# Patient Record
Sex: Female | Born: 1975 | Race: White | Hispanic: No | Marital: Married | Smoking: Never smoker
Health system: Southern US, Community
[De-identification: ages and names within clinical notes are randomized; demographics above are authoritative.]

## PROBLEM LIST (undated history)

## (undated) ENCOUNTER — Inpatient Hospital Stay (HOSPITAL_COMMUNITY): Payer: Self-pay

## (undated) DIAGNOSIS — B379 Candidiasis, unspecified: Secondary | ICD-10-CM

## (undated) DIAGNOSIS — Z975 Presence of (intrauterine) contraceptive device: Secondary | ICD-10-CM

## (undated) DIAGNOSIS — H9191 Unspecified hearing loss, right ear: Secondary | ICD-10-CM

## (undated) DIAGNOSIS — R7611 Nonspecific reaction to tuberculin skin test without active tuberculosis: Secondary | ICD-10-CM

## (undated) HISTORY — DX: Presence of (intrauterine) contraceptive device: Z97.5

## (undated) HISTORY — PX: WISDOM TOOTH EXTRACTION: SHX21

## (undated) HISTORY — DX: Unspecified hearing loss, right ear: H91.91

## (undated) HISTORY — DX: Candidiasis, unspecified: B37.9

## (undated) HISTORY — DX: Nonspecific reaction to tuberculin skin test without active tuberculosis: R76.11

---

## 1998-09-16 ENCOUNTER — Encounter: Admission: RE | Admit: 1998-09-16 | Discharge: 1998-09-16 | Payer: Self-pay | Admitting: Infectious Diseases

## 2000-01-30 ENCOUNTER — Other Ambulatory Visit: Admission: RE | Admit: 2000-01-30 | Discharge: 2000-01-30 | Payer: Self-pay | Admitting: Obstetrics and Gynecology

## 2001-02-24 ENCOUNTER — Other Ambulatory Visit: Admission: RE | Admit: 2001-02-24 | Discharge: 2001-02-24 | Payer: Self-pay | Admitting: Obstetrics and Gynecology

## 2002-08-20 ENCOUNTER — Other Ambulatory Visit: Admission: RE | Admit: 2002-08-20 | Discharge: 2002-08-20 | Payer: Self-pay | Admitting: Obstetrics and Gynecology

## 2002-12-15 ENCOUNTER — Encounter: Payer: Self-pay | Admitting: Obstetrics and Gynecology

## 2002-12-15 ENCOUNTER — Ambulatory Visit (HOSPITAL_COMMUNITY): Admission: RE | Admit: 2002-12-15 | Discharge: 2002-12-15 | Payer: Self-pay | Admitting: Obstetrics and Gynecology

## 2003-03-19 ENCOUNTER — Inpatient Hospital Stay (HOSPITAL_COMMUNITY): Admission: AD | Admit: 2003-03-19 | Discharge: 2003-03-21 | Payer: Self-pay | Admitting: Obstetrics and Gynecology

## 2003-03-22 ENCOUNTER — Encounter: Admission: RE | Admit: 2003-03-22 | Discharge: 2003-04-21 | Payer: Self-pay | Admitting: Obstetrics and Gynecology

## 2003-09-30 ENCOUNTER — Other Ambulatory Visit: Admission: RE | Admit: 2003-09-30 | Discharge: 2003-09-30 | Payer: Self-pay | Admitting: Obstetrics and Gynecology

## 2004-10-03 ENCOUNTER — Other Ambulatory Visit: Admission: RE | Admit: 2004-10-03 | Discharge: 2004-10-03 | Payer: Self-pay | Admitting: Obstetrics and Gynecology

## 2006-01-04 ENCOUNTER — Inpatient Hospital Stay (HOSPITAL_COMMUNITY): Admission: AD | Admit: 2006-01-04 | Discharge: 2006-01-06 | Payer: Self-pay | Admitting: Obstetrics and Gynecology

## 2006-08-26 ENCOUNTER — Other Ambulatory Visit: Admission: RE | Admit: 2006-08-26 | Discharge: 2006-08-26 | Payer: Self-pay | Admitting: Obstetrics and Gynecology

## 2012-10-07 ENCOUNTER — Ambulatory Visit (INDEPENDENT_AMBULATORY_CARE_PROVIDER_SITE_OTHER): Payer: BC Managed Care – PPO | Admitting: Obstetrics and Gynecology

## 2012-10-07 ENCOUNTER — Encounter: Payer: Self-pay | Admitting: Obstetrics and Gynecology

## 2012-10-07 VITALS — BP 100/70 | HR 70 | Ht 63.5 in | Wt 126.0 lb

## 2012-10-07 DIAGNOSIS — Z975 Presence of (intrauterine) contraceptive device: Secondary | ICD-10-CM

## 2012-10-07 DIAGNOSIS — Z124 Encounter for screening for malignant neoplasm of cervix: Secondary | ICD-10-CM

## 2012-10-07 DIAGNOSIS — Z01419 Encounter for gynecological examination (general) (routine) without abnormal findings: Secondary | ICD-10-CM

## 2012-10-07 NOTE — Progress Notes (Signed)
Subjective:    Tamara Reese is a 36 y.o. female, G2P2, who presents for an annual exam. The patient reports spotting since IUD insertion, typically 1- 2 times a month x 5-7 days but no cramping.  IUD was inserted 09/15/08.  Menstrual cycle:   LMP: Patient's last menstrual period was 10/04/2012.             Review of Systems Pertinent items are noted in HPI. Denies pelvic pain, urinary tract symptoms, vaginitis symptoms, irregular bleeding, menopausal symptoms, change in bowel habits Reese rectal bleeding   Objective:    BP 100/70  Pulse 70  Ht 5' 3.5" (1.613 m)  Wt 126 lb (57.153 kg)  BMI 21.97 kg/m2  LMP 10/04/2012   Wt Readings from Last 1 Encounters:  10/07/12 126 lb (57.153 kg)   Body mass index is 21.97 kg/(m^2). General Appearance: Alert, no acute distress HEENT: Grossly normal Neck / Thyroid: Supple, no thyromegaly Reese cervical adenopathy Lungs: Clear to auscultation bilaterally Back: No CVA tenderness Breast Exam: No masses Reese nodes.No dimpling, nipple retraction Reese discharge. Cardiovascular: Regular rate and rhythm.  Gastrointestinal: Soft, non-tender, no masses Reese organomegaly Pelvic Exam: EGBUS-wnl, vagina-normal rugae, cervix- without lesions Reese tenderness, uterus appears normal size shape and consistency, adnexae-no masses Reese tenderness Rectovaginal: no masses and normal sphincter tone Lymphatic Exam: Non-palpable nodes in neck, clavicular,  axillary, Reese inguinal regions  Skin: no rashes Reese abnormalities Extremities: no clubbing cyanosis Reese edema  Neurologic: grossly normal Psychiatric: Alert and oriented  UPT: negative   Assessment:   Routine GYN Exam   Plan:    PAP sent  RTO 1 year Reese prn  Destane Speas,ELMIRAPA-C

## 2012-10-07 NOTE — Patient Instructions (Signed)
Multivitamin with iron and at least 400 mcg of folic acid

## 2012-10-07 NOTE — Progress Notes (Signed)
Regular Periods: no Mammogram: no  Monthly Breast Ex.: yes Exercise: yes  Tetanus < 10 years: no Seatbelts: yes  NI. Bladder Functn.: yes Abuse at home: no  Daily BM's: yes Stressful Work: yes  Healthy Diet: yes Sigmoid-Colonoscopy: no  Calcium: no Medical problems this year: no problems   LAST WUJ:WJXBJ HPV NOT DETECTED  Contraception: IUD MIRENA  Mammogram:  NO  PCP: NO  PMH:  NO CHANGE  FMH: NO CHANGE  Last Bone Scan: NO  PT IS MARRIED

## 2012-10-09 LAB — PAP IG W/ RFLX HPV ASCU

## 2012-10-10 LAB — HUMAN PAPILLOMAVIRUS, HIGH RISK: HPV DNA High Risk: NOT DETECTED

## 2012-10-13 ENCOUNTER — Encounter: Payer: Self-pay | Admitting: Obstetrics and Gynecology

## 2014-10-18 ENCOUNTER — Encounter: Payer: Self-pay | Admitting: Obstetrics and Gynecology

## 2014-10-21 LAB — OB RESULTS CONSOLE ANTIBODY SCREEN: Antibody Screen: NEGATIVE

## 2014-10-21 LAB — OB RESULTS CONSOLE HIV ANTIBODY (ROUTINE TESTING): HIV: NONREACTIVE

## 2014-10-21 LAB — OB RESULTS CONSOLE GC/CHLAMYDIA
Chlamydia: NEGATIVE
Gonorrhea: NEGATIVE

## 2014-10-21 LAB — OB RESULTS CONSOLE RPR: RPR: NONREACTIVE

## 2014-10-21 LAB — OB RESULTS CONSOLE RUBELLA ANTIBODY, IGM: Rubella: IMMUNE

## 2014-10-21 LAB — OB RESULTS CONSOLE ABO/RH: RH Type: POSITIVE

## 2014-10-21 LAB — OB RESULTS CONSOLE HEPATITIS B SURFACE ANTIGEN: Hepatitis B Surface Ag: NEGATIVE

## 2014-11-16 ENCOUNTER — Inpatient Hospital Stay (HOSPITAL_COMMUNITY)
Admission: AD | Admit: 2014-11-16 | Discharge: 2014-11-16 | Disposition: A | Discharge status: Home / Self Care | Payer: No Typology Code available for payment source | Source: Ambulatory Visit | Attending: Obstetrics & Gynecology | Admitting: Obstetrics & Gynecology

## 2014-11-16 ENCOUNTER — Encounter (HOSPITAL_COMMUNITY): Payer: Self-pay

## 2014-11-16 DIAGNOSIS — R0602 Shortness of breath: Secondary | ICD-10-CM | POA: Diagnosis present

## 2014-11-16 DIAGNOSIS — O210 Mild hyperemesis gravidarum: Secondary | ICD-10-CM | POA: Insufficient documentation

## 2014-11-16 DIAGNOSIS — R42 Dizziness and giddiness: Secondary | ICD-10-CM | POA: Diagnosis not present

## 2014-11-16 DIAGNOSIS — O9989 Other specified diseases and conditions complicating pregnancy, childbirth and the puerperium: Secondary | ICD-10-CM | POA: Insufficient documentation

## 2014-11-16 DIAGNOSIS — Z3A10 10 weeks gestation of pregnancy: Secondary | ICD-10-CM | POA: Insufficient documentation

## 2014-11-16 LAB — CBC
HCT: 35.8 % — ABNORMAL LOW (ref 36.0–46.0)
HEMOGLOBIN: 12.7 g/dL (ref 12.0–15.0)
MCH: 34.7 pg — AB (ref 26.0–34.0)
MCHC: 35.5 g/dL (ref 30.0–36.0)
MCV: 97.8 fL (ref 78.0–100.0)
Platelets: 252 10*3/uL (ref 150–400)
RBC: 3.66 MIL/uL — AB (ref 3.87–5.11)
RDW: 12.5 % (ref 11.5–15.5)
WBC: 9.3 10*3/uL (ref 4.0–10.5)

## 2014-11-16 LAB — URINALYSIS, ROUTINE W REFLEX MICROSCOPIC
Bilirubin Urine: NEGATIVE
Glucose, UA: NEGATIVE mg/dL
Ketones, ur: NEGATIVE mg/dL
Leukocytes, UA: NEGATIVE
Nitrite: NEGATIVE
Protein, ur: NEGATIVE mg/dL
Specific Gravity, Urine: >1.03 — ABNORMAL HIGH (ref 1.005–1.030)
Urobilinogen, UA: 0.2 mg/dL (ref 0.0–1.0)
pH: 6 (ref 5.0–8.0)

## 2014-11-16 LAB — URINE MICROSCOPIC-ADD ON

## 2014-11-16 MED ORDER — LACTATED RINGERS IV BOLUS (SEPSIS): 500.0000 mL | Freq: Once | INTRAVENOUS | Status: DC

## 2014-11-16 MED ORDER — PROMETHAZINE HCL 25 MG/ML IJ SOLN: 12.5000 mg | Freq: Once | INTRAMUSCULAR | Status: DC

## 2014-11-16 MED ORDER — LACTATED RINGERS IV SOLN: INTRAVENOUS | Status: DC

## 2014-11-16 NOTE — MAU Provider Note (Signed)
History    Tamara Reese is a 38 y.o. G3P2002 at 10.6wks who presents for intermittent nausea, dizziness, and SOB.  Patient reports grogginess on Saturday and Sunday after returning home from traveling via plane. States dizziness started yesterday and is more pronounced today.  Reports feeling winded when doing "day to day things."  Denies SOB with rest and reports able "to calm myself after walking." Denies vomiting, reports nausea, but not taking any medications.  No issues with constipation or diarrhea.  No issues with urination. Reports drinking 4-6 "kitchen glasses" of water every day and adequate nutrition as not eating brings about nausea. No history of anemia or heart conditions.  No issues with vaginal discharge or bleeding.   There are no active problems to display for this patient.   Chief Complaint  Patient presents with  . Dizziness  . Shortness of Breath  . Nausea   HPI  OB History    Gravida Para Term Preterm AB TAB SAB Ectopic Multiple Living   3 2        2       Past Medical History  Diagnosis Date  . 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 IUD (intrauterine device) in place   . Yeast infection     h/o  . Deafness in right ear     Past Surgical History  Procedure Laterality Date  . Wisdom tooth extraction      Family History  Problem Relation Age of Onset  . Cancer Maternal Aunt     breast  . Stroke Maternal Grandmother   . Heart disease Maternal Grandfather   . Hypertension Maternal Grandfather   . Heart disease Paternal Grandfather     History  Substance Use Topics  . Smoking status: Never Smoker   . Smokeless tobacco: Never Used  . Alcohol Use: Yes    Allergies:  Allergies  Allergen Reactions  . Erythromycin   . Keflex [Cephalexin]   . Penicillins     No prescriptions prior to admission    ROS  See HPI Above Physical Exam   Blood pressure 114/65, pulse 80, temperature 98.9 F (37.2 C), temperature source Oral, resp. rate 16, height 5'  3" (1.6 m), weight 136 lb (61.689 kg), last menstrual period 09/01/2014, SpO2 100 %. Results for orders placed or performed during the hospital encounter of 11/16/14 (from the past 24 hour(s))  Urinalysis, Routine w reflex microscopic     Status: Abnormal   Collection Time: 11/16/14  5:45 PM  Result Value Ref Range   Color, Urine YELLOW YELLOW   APPearance CLEAR CLEAR   Specific Gravity, Urine >1.030 (H) 1.005 - 1.030   pH 6.0 5.0 - 8.0   Glucose, UA NEGATIVE NEGATIVE mg/dL   Hgb urine dipstick SMALL (A) NEGATIVE   Bilirubin Urine NEGATIVE NEGATIVE   Ketones, ur NEGATIVE NEGATIVE mg/dL   Protein, ur NEGATIVE NEGATIVE mg/dL   Urobilinogen, UA 0.2 0.0 - 1.0 mg/dL   Nitrite NEGATIVE NEGATIVE   Leukocytes, UA NEGATIVE NEGATIVE  Urine microscopic-add on     Status: Abnormal   Collection Time: 11/16/14  5:45 PM  Result Value Ref Range   Squamous Epithelial / LPF FEW (A) RARE   WBC, UA 0-2 <3 WBC/hpf   RBC / HPF 0-2 <3 RBC/hpf  CBC     Status: Abnormal   Collection Time: 11/16/14  8:18 PM  Result Value Ref Range   WBC 9.3 4.0 - 10.5 K/uL   RBC 3.66 (L) 3.87 -  5.11 MIL/uL   Hemoglobin 12.7 12.0 - 15.0 g/dL   HCT 86.535.8 (L) 78.436.0 - 69.646.0 %   MCV 97.8 78.0 - 100.0 fL   MCH 34.7 (H) 26.0 - 34.0 pg   MCHC 35.5 30.0 - 36.0 g/dL   RDW 29.512.5 28.411.5 - 13.215.5 %   Platelets 252 150 - 400 K/uL    Physical Exam  Vitals reviewed. Constitutional: She is oriented to person, place, and time. She appears well-developed and well-nourished. No distress.  Cardiovascular: Normal rate, regular rhythm and normal heart sounds.   Respiratory: Effort normal and breath sounds normal. No respiratory distress.  GI: Soft.  Musculoskeletal: Normal range of motion.  Neurological: She is alert and oriented to person, place, and time.  Skin: Skin is warm and dry.    ED Course  Assessment: IUP at 10.6wks Dizziness Nausea  Plan: -UA-Few Bacteria, Culture sent -IV Bolus -CBC-Pending -Declines IV  anti-emetic  Follow Up (2100) -Patient declines IV fluids -CBC as above and within normal limits -Encouraged to rest  -Encouraged to call if any questions or concerns arise prior to next scheduled office visit.  -Bleeding Precautions Given -Discharged to home in stable condition  Shandon Matson LYNN CNM, MSN 11/16/2014 7:36 PM

## 2014-11-16 NOTE — MAU Note (Signed)
Patient sates she traveled over the weekend. Started to feel not as well on 11-28 but has progressed until today has had random dizziness and shortness of breath that is worse with lying down. Some nausea, no vomiting. Denies vaginal bleeding, abdominal pain or discharge.

## 2014-11-16 NOTE — MAU Note (Signed)
Pt states she has been feeling dizzy throughout pregnancy , weakness started on Saturday and dizziness started yesterday and became worse today

## 2014-11-16 NOTE — Discharge Instructions (Signed)

## 2014-11-17 LAB — CULTURE, OB URINE
Colony Count: NO GROWTH
Culture: NO GROWTH

## 2014-12-17 NOTE — L&D Delivery Note (Signed)
Delivery Note At 9:02 PM a viable female "Funk" was delivered via Vaginal, Spontaneous Delivery (Presentation: OA restituting to Right Occiput Anterior).  APGARS: 8, 9; weight pending.   Placenta status: Intact, Spontaneous.  Cord: 3 vessels with the following complications: None.  Cord pH: NA  Anesthesia: Epidural  Episiotomy: None Lacerations: 1st degree vaginal Suture Repair: 3.0 vicryl Est. Blood Loss (mL): 451  Mom to postpartum.  Baby to Couplet care / Skin to Skin.  Breastfeeding.  Plans Micronor for contraception.  Sherre Scarlet 06/06/2015, 9:32 PM

## 2015-05-02 ENCOUNTER — Encounter (HOSPITAL_COMMUNITY): Payer: Self-pay | Admitting: *Deleted

## 2015-05-02 ENCOUNTER — Inpatient Hospital Stay (HOSPITAL_COMMUNITY)
Admission: AD | Admit: 2015-05-02 | Discharge: 2015-05-02 | Disposition: A | Discharge status: Home / Self Care | Payer: BLUE CROSS/BLUE SHIELD | Source: Ambulatory Visit | Attending: Obstetrics & Gynecology | Admitting: Obstetrics & Gynecology

## 2015-05-02 DIAGNOSIS — Z3A34 34 weeks gestation of pregnancy: Secondary | ICD-10-CM | POA: Diagnosis not present

## 2015-05-02 DIAGNOSIS — O368130 Decreased fetal movements, third trimester, not applicable or unspecified: Secondary | ICD-10-CM | POA: Diagnosis present

## 2015-05-02 DIAGNOSIS — Z3493 Encounter for supervision of normal pregnancy, unspecified, third trimester: Secondary | ICD-10-CM

## 2015-05-02 NOTE — MAU Provider Note (Signed)
Tamara Reese is a 39 y.o. G3P2 at 34.5 weeks presented to MAU c/o decrease fetal movement earlier and ctx earlier, both have resolved.  She denies vb or lof with +FM at this time.   History     There are no active problems to display for this patient.   No chief complaint on file.  HPI  OB History    Gravida Para Term Preterm AB TAB SAB Ectopic Multiple Living   3 2        2       Past Medical History  Diagnosis Date  . 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 IUD (intrauterine device) in place   . Yeast infection     h/o  . Deafness in right ear     Past Surgical History  Procedure Laterality Date  . Wisdom tooth extraction      Family History  Problem Relation Age of Onset  . Cancer Maternal Aunt     breast  . Stroke Maternal Grandmother   . Heart disease Maternal Grandfather   . Hypertension Maternal Grandfather   . Heart disease Paternal Grandfather     History  Substance Use Topics  . Smoking status: Never Smoker   . Smokeless tobacco: Never Used  . Alcohol Use: No    Allergies:  Allergies  Allergen Reactions  . Erythromycin Nausea Only  . Keflex [Cephalexin] Swelling  . Penicillins Rash    Prescriptions prior to admission  Medication Sig Dispense Refill Last Dose  . Prenatal Vit-Fe Fumarate-FA (PRENATAL MULTIVITAMIN) TABS tablet Take 1 tablet by mouth daily at 12 noon.   05/01/2015 at Unknown time  . acetaminophen (TYLENOL) 500 MG tablet Take 500 mg by mouth every 6 (six) hours as needed for moderate pain or fever.   More than a month at Unknown time    ROS See HPI above, all other systems are negative  Physical Exam   Blood pressure 129/72, pulse 87, temperature 97.8 F (36.6 C), temperature source Oral, resp. rate 16, height 5\' 3"  (1.6 m), weight 166 lb (75.297 kg), last menstrual period 09/01/2014.  Physical Exam Ext:  WNL ABD: Soft, non tender to palpation, no rebound or guarding SVE:   ED Course  Assessment: IUP at   34.5weeks Membranes: intact FHR: Category 1 CTX:  none minutes VE unchange   Plan: -Discussed need to follow up in office  -Bleeding and PTL Precautions -Encouraged to call if any questions or concerns arise prior to next scheduled office visit.  -Discharged to home in stable condition   Guerry Covington, CNM, MSN 05/02/2015. 9:39 PM

## 2015-05-02 NOTE — MAU Note (Signed)
Pt. States she has been contracting regularly since 4 pm today. No leakage of fluid or bleeding. + Baby movement. Last appointment was 2 Fridays ago. Next appointment is scheduled for this coming Friday.

## 2015-05-02 NOTE — Discharge Instructions (Signed)

## 2015-05-05 LAB — OB RESULTS CONSOLE GBS: GBS: NEGATIVE

## 2015-06-04 ENCOUNTER — Encounter (HOSPITAL_COMMUNITY): Payer: Self-pay | Admitting: *Deleted

## 2015-06-04 ENCOUNTER — Inpatient Hospital Stay (HOSPITAL_COMMUNITY)
Admission: AD | Admit: 2015-06-04 | Discharge: 2015-06-04 | Disposition: A | Discharge status: Home / Self Care | Payer: BLUE CROSS/BLUE SHIELD | Source: Ambulatory Visit | Attending: Obstetrics and Gynecology | Admitting: Obstetrics and Gynecology

## 2015-06-04 DIAGNOSIS — Z3A39 39 weeks gestation of pregnancy: Secondary | ICD-10-CM | POA: Insufficient documentation

## 2015-06-04 DIAGNOSIS — O479 False labor, unspecified: Secondary | ICD-10-CM

## 2015-06-04 NOTE — Progress Notes (Signed)
Gerrit Heck CNM in to reck pt. Discussed d/c plan.  Written and verbal d/c instructions given and understanding voiced.

## 2015-06-04 NOTE — Progress Notes (Signed)
Gerrit Heck CNM in to see pt and discuss plan of care

## 2015-06-04 NOTE — Progress Notes (Signed)
Gerrit Heck CNM aware of pt's admission and status. WIll see pt

## 2015-06-04 NOTE — Discharge Instructions (Signed)
Braxton Hicks Contractions °Contractions of the uterus can occur throughout pregnancy. Contractions are not always a sign that you are in labor.  °WHAT ARE BRAXTON HICKS CONTRACTIONS?  °Contractions that occur before labor are called Braxton Hicks contractions, or false labor. Toward the end of pregnancy (32-34 weeks), these contractions can develop more often and may become more forceful. This is not true labor because these contractions do not result in opening (dilatation) and thinning of the cervix. They are sometimes difficult to tell apart from true labor because these contractions can be forceful and people have different pain tolerances. You should not feel embarrassed if you go to the hospital with false labor. Sometimes, the only way to tell if you are in true labor is for your health care provider to look for changes in the cervix. °If there are no prenatal problems or other health problems associated with the pregnancy, it is completely safe to be sent home with false labor and await the onset of true labor. °HOW CAN YOU TELL THE DIFFERENCE BETWEEN TRUE AND FALSE LABOR? °False Labor °· The contractions of false labor are usually shorter and not as hard as those of true labor.   °· The contractions are usually irregular.   °· The contractions are often felt in the front of the lower abdomen and in the groin.   °· The contractions may go away when you walk around or change positions while lying down.   °· The contractions get weaker and are shorter lasting as time goes on.   °· The contractions do not usually become progressively stronger, regular, and closer together as with true labor.   °True Labor °· Contractions in true labor last 30-70 seconds, become very regular, usually become more intense, and increase in frequency.   °· The contractions do not go away with walking.   °· The discomfort is usually felt in the top of the uterus and spreads to the lower abdomen and low back.   °· True labor can be  determined by your health care provider with an exam. This will show that the cervix is dilating and getting thinner.   °WHAT TO REMEMBER °· Keep up with your usual exercises and follow other instructions given by your health care provider.   °· Take medicines as directed by your health care provider.   °· Keep your regular prenatal appointments.   °· Eat and drink lightly if you think you are going into labor.   °· If Braxton Hicks contractions are making you uncomfortable:   °¨ Change your position from lying down or resting to walking, or from walking to resting.   °¨ Sit and rest in a tub of warm water.   °¨ Drink 2-3 glasses of water. Dehydration may cause these contractions.   °¨ Do slow and deep breathing several times an hour.   °WHEN SHOULD I SEEK IMMEDIATE MEDICAL CARE? °Seek immediate medical care if: °· Your contractions become stronger, more regular, and closer together.   °· You have fluid leaking or gushing from your vagina.   °· You have a fever.   °· You pass blood-tinged mucus.   °· You have vaginal bleeding.   °· You have continuous abdominal pain.   °· You have low back pain that you never had before.   °· You feel your baby's head pushing down and causing pelvic pressure.   °· Your baby is not moving as much as it used to.   °Document Released: 12/03/2005 Document Revised: 12/08/2013 Document Reviewed: 09/14/2013 °[employer]® Patient Information ©2015 [employer]. This information is not intended to replace advice given to you by your health care   provider. Make sure you discuss any questions you have with your health care provider. ° °

## 2015-06-04 NOTE — Progress Notes (Signed)
Jessica Emly CNM in to see pt 

## 2015-06-04 NOTE — MAU Provider Note (Signed)
History    Tamara Reese is a 38y.o. [id number] at 39.3wks who presents, after phone call, for contractions.  Patient states contractions have increased since phone call and she lost her mucous plug.  Patient denies LoF and Vb, but reports active fetus.    There are no active problems to display for this patient.   Chief Complaint  Patient presents with  . Contractions   HPI  OB History    Gravida Para Term Preterm AB TAB SAB Ectopic Multiple Living   3 2        2       Past Medical History  Diagnosis Date  . 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 IUD (intrauterine device) in place   . Yeast infection     h/o  . Deafness in right ear     Past Surgical History  Procedure Laterality Date  . Wisdom tooth extraction      Family History  Problem Relation Age of Onset  . Cancer Maternal Aunt     breast  . Stroke Maternal Grandmother   . Heart disease Maternal Grandfather   . Hypertension Maternal Grandfather   . Heart disease Paternal Grandfather     History  Substance Use Topics  . Smoking status: Never Smoker   . Smokeless tobacco: Never Used  . Alcohol Use: No    Allergies:  Allergies  Allergen Reactions  . Erythromycin Nausea Only  . Keflex [Cephalexin] Swelling  . Penicillins Rash    Prescriptions prior to admission  Medication Sig Dispense Refill Last Dose  . calcium carbonate (TUMS - DOSED IN MG ELEMENTAL CALCIUM) 500 MG chewable tablet Chew 4 tablets by mouth daily.   06/04/2015 at Unknown time  . Prenatal Vit-Fe Fumarate-FA (PRENATAL MULTIVITAMIN) TABS tablet Take 1 tablet by mouth daily at 12 noon.   06/03/2015 at Unknown time  . ranitidine (ZANTAC) 150 MG tablet Take 150 mg by mouth 2 (two) times daily.   06/04/2015 at Unknown time  . acetaminophen (TYLENOL) 500 MG tablet Take 500 mg by mouth every 6 (six) hours as needed for moderate pain or fever.   More than a month at Unknown time    ROS  See HPI Above Physical Exam   Blood pressure 127/80, pulse  79, temperature 97.2 F (36.2 C), resp. rate 18, last menstrual period 09/01/2014.  No results found for this or any previous visit (from the past 24 hour(s)).  Physical Exam SVE: 3/50/-2  FHR:130 bpm, Mod Var, -Decels, +Accels UC: Q1-34min, palpates mild to moderate ED Course  Assessment: IUP at 39.3wks Cat I FT Early Labor  Plan: -Given Early Labor Options:  A: Home with or w/o therapeutic rest B: Ambulate and reassess C: Rest with or w/o pain medication and reassess -Patient opts to rest and reassess later -Will reassess in 2.5 hours or earlier as appropriate   Follow Up (0400) -SVE remains the same -Discussed who and when to call regarding labor concerns -Reviewed labor precautions -Encouraged to call if any questions or concerns throughout the night -Discharged to home in stable condition   Antoneo Ghrist LYNN CNM, MSN 06/04/2015 1:32 AM

## 2015-06-04 NOTE — MAU Note (Signed)
Seen in office Friday and membranes stripped. 2cm then. Contractions since 2045. Some bloody d/c since sve in office. Denies LOF

## 2015-06-06 ENCOUNTER — Encounter (HOSPITAL_COMMUNITY): Payer: Self-pay | Admitting: *Deleted

## 2015-06-06 ENCOUNTER — Encounter (HOSPITAL_COMMUNITY): Payer: Self-pay

## 2015-06-06 ENCOUNTER — Inpatient Hospital Stay (HOSPITAL_COMMUNITY): Payer: BLUE CROSS/BLUE SHIELD | Admitting: Anesthesiology

## 2015-06-06 ENCOUNTER — Inpatient Hospital Stay (HOSPITAL_COMMUNITY)
Admission: AD | Admit: 2015-06-06 | Discharge: 2015-06-06 | Disposition: A | Discharge status: Home / Self Care | Payer: BLUE CROSS/BLUE SHIELD | Source: Ambulatory Visit | Attending: Obstetrics and Gynecology | Admitting: Obstetrics and Gynecology

## 2015-06-06 ENCOUNTER — Inpatient Hospital Stay (HOSPITAL_COMMUNITY)
Admission: AD | Admit: 2015-06-06 | Discharge: 2015-06-08 | DRG: 775 | Disposition: A | Discharge status: Home / Self Care | Payer: BLUE CROSS/BLUE SHIELD | Source: Ambulatory Visit | Attending: Obstetrics & Gynecology | Admitting: Obstetrics & Gynecology

## 2015-06-06 ENCOUNTER — Inpatient Hospital Stay (HOSPITAL_COMMUNITY): Payer: BLUE CROSS/BLUE SHIELD

## 2015-06-06 DIAGNOSIS — O09523 Supervision of elderly multigravida, third trimester: Secondary | ICD-10-CM

## 2015-06-06 DIAGNOSIS — H9191 Unspecified hearing loss, right ear: Secondary | ICD-10-CM | POA: Diagnosis present

## 2015-06-06 DIAGNOSIS — Z3A39 39 weeks gestation of pregnancy: Secondary | ICD-10-CM | POA: Diagnosis present

## 2015-06-06 DIAGNOSIS — O288 Other abnormal findings on antenatal screening of mother: Secondary | ICD-10-CM | POA: Insufficient documentation

## 2015-06-06 DIAGNOSIS — Z8249 Family history of ischemic heart disease and other diseases of the circulatory system: Secondary | ICD-10-CM | POA: Diagnosis not present

## 2015-06-06 DIAGNOSIS — D649 Anemia, unspecified: Secondary | ICD-10-CM | POA: Diagnosis present

## 2015-06-06 DIAGNOSIS — O9902 Anemia complicating childbirth: Secondary | ICD-10-CM | POA: Diagnosis present

## 2015-06-06 DIAGNOSIS — Z823 Family history of stroke: Secondary | ICD-10-CM

## 2015-06-06 DIAGNOSIS — O368190 Decreased fetal movements, unspecified trimester, not applicable or unspecified: Secondary | ICD-10-CM

## 2015-06-06 LAB — POCT FERN TEST
POCT FERN TEST: NEGATIVE
POCT Fern Test: NEGATIVE

## 2015-06-06 LAB — TYPE AND SCREEN
ABO/RH(D): A POS
Antibody Screen: NEGATIVE

## 2015-06-06 LAB — CBC
HCT: 35.4 % — ABNORMAL LOW (ref 36.0–46.0)
Hemoglobin: 11.7 g/dL — ABNORMAL LOW (ref 12.0–15.0)
MCH: 29.3 pg (ref 26.0–34.0)
MCHC: 33.1 g/dL (ref 30.0–36.0)
MCV: 88.5 fL (ref 78.0–100.0)
PLATELETS: 229 10*3/uL (ref 150–400)
RBC: 4 MIL/uL (ref 3.87–5.11)
RDW: 14.2 % (ref 11.5–15.5)
WBC: 17.9 10*3/uL — AB (ref 4.0–10.5)

## 2015-06-06 LAB — ABO/RH: ABO/RH(D): A POS

## 2015-06-06 LAB — AMNISURE RUPTURE OF MEMBRANE (ROM) NOT AT ARMC: AMNISURE: NEGATIVE

## 2015-06-06 MED ORDER — OXYTOCIN 10 UNIT/ML IJ SOLN
INTRAMUSCULAR | Status: AC
  Filled 2015-06-06: qty 1

## 2015-06-06 MED ORDER — WITCH HAZEL-GLYCERIN EX PADS: 1.0000 "application " | MEDICATED_PAD | CUTANEOUS | Status: DC | PRN

## 2015-06-06 MED ORDER — PRENATAL MULTIVITAMIN CH
1.0000 | ORAL_TABLET | Freq: Every day | ORAL | Status: DC
  Administered 2015-06-07: 1 via ORAL
  Filled 2015-06-06: qty 1

## 2015-06-06 MED ORDER — PHENYLEPHRINE 40 MCG/ML (10ML) SYRINGE FOR IV PUSH (FOR BLOOD PRESSURE SUPPORT)
80.0000 ug | PREFILLED_SYRINGE | INTRAVENOUS | Status: DC | PRN
  Filled 2015-06-06: qty 20
  Filled 2015-06-06: qty 2

## 2015-06-06 MED ORDER — DIPHENHYDRAMINE HCL 25 MG PO CAPS: 25.0000 mg | ORAL_CAPSULE | Freq: Four times a day (QID) | ORAL | Status: DC | PRN

## 2015-06-06 MED ORDER — CITRIC ACID-SODIUM CITRATE 334-500 MG/5ML PO SOLN: 30.0000 mL | ORAL | Status: DC | PRN

## 2015-06-06 MED ORDER — OXYCODONE-ACETAMINOPHEN 5-325 MG PO TABS: 2.0000 | ORAL_TABLET | ORAL | Status: DC | PRN

## 2015-06-06 MED ORDER — ONDANSETRON HCL 4 MG/2ML IJ SOLN: 4.0000 mg | INTRAMUSCULAR | Status: DC | PRN

## 2015-06-06 MED ORDER — ZOLPIDEM TARTRATE 5 MG PO TABS: 5.0000 mg | ORAL_TABLET | Freq: Every evening | ORAL | Status: DC | PRN

## 2015-06-06 MED ORDER — OXYCODONE-ACETAMINOPHEN 5-325 MG PO TABS
1.0000 | ORAL_TABLET | Freq: Once | ORAL | Status: AC
  Administered 2015-06-06: 1 via ORAL
  Filled 2015-06-06: qty 1

## 2015-06-06 MED ORDER — LIDOCAINE HCL (PF) 1 % IJ SOLN
INTRAMUSCULAR | Status: DC | PRN
  Administered 2015-06-06: 6 mL
  Administered 2015-06-06: 4 mL

## 2015-06-06 MED ORDER — ONDANSETRON HCL 4 MG PO TABS: 4.0000 mg | ORAL_TABLET | ORAL | Status: DC | PRN

## 2015-06-06 MED ORDER — SIMETHICONE 80 MG PO CHEW: 80.0000 mg | CHEWABLE_TABLET | ORAL | Status: DC | PRN

## 2015-06-06 MED ORDER — DIPHENHYDRAMINE HCL 50 MG/ML IJ SOLN: 12.5000 mg | INTRAMUSCULAR | Status: DC | PRN

## 2015-06-06 MED ORDER — LIDOCAINE HCL (PF) 1 % IJ SOLN
30.0000 mL | INTRAMUSCULAR | Status: DC | PRN
  Filled 2015-06-06: qty 30

## 2015-06-06 MED ORDER — ACETAMINOPHEN 325 MG PO TABS: 650.0000 mg | ORAL_TABLET | ORAL | Status: DC | PRN

## 2015-06-06 MED ORDER — LANOLIN HYDROUS EX OINT: TOPICAL_OINTMENT | CUTANEOUS | Status: DC | PRN

## 2015-06-06 MED ORDER — NALBUPHINE HCL 10 MG/ML IJ SOLN
10.0000 mg | Freq: Once | INTRAMUSCULAR | Status: AC | PRN
  Administered 2015-06-06: 10 mg via INTRAMUSCULAR
  Filled 2015-06-06: qty 1

## 2015-06-06 MED ORDER — IBUPROFEN 600 MG PO TABS
600.0000 mg | ORAL_TABLET | Freq: Four times a day (QID) | ORAL | Status: DC
  Administered 2015-06-07 – 2015-06-08 (×6): 600 mg via ORAL
  Filled 2015-06-06 (×6): qty 1

## 2015-06-06 MED ORDER — EPHEDRINE 5 MG/ML INJ
10.0000 mg | INTRAVENOUS | Status: DC | PRN
  Filled 2015-06-06: qty 2

## 2015-06-06 MED ORDER — FLEET ENEMA 7-19 GM/118ML RE ENEM: 1.0000 | ENEMA | RECTAL | Status: DC | PRN

## 2015-06-06 MED ORDER — TETANUS-DIPHTH-ACELL PERTUSSIS 5-2.5-18.5 LF-MCG/0.5 IM SUSP
0.5000 mL | Freq: Once | INTRAMUSCULAR | Status: AC
  Administered 2015-06-07: 0.5 mL via INTRAMUSCULAR
  Filled 2015-06-06: qty 0.5

## 2015-06-06 MED ORDER — OXYTOCIN BOLUS FROM INFUSION: 500.0000 mL | INTRAVENOUS | Status: DC

## 2015-06-06 MED ORDER — OXYCODONE-ACETAMINOPHEN 10-325 MG PO TABS: 1.0000 | ORAL_TABLET | ORAL | Status: DC | PRN

## 2015-06-06 MED ORDER — OXYCODONE-ACETAMINOPHEN 5-325 MG PO TABS: 1.0000 | ORAL_TABLET | ORAL | Status: DC | PRN

## 2015-06-06 MED ORDER — LACTATED RINGERS IV SOLN
500.0000 mL | INTRAVENOUS | Status: DC | PRN
  Administered 2015-06-06: 500 mL via INTRAVENOUS

## 2015-06-06 MED ORDER — SENNOSIDES-DOCUSATE SODIUM 8.6-50 MG PO TABS
2.0000 | ORAL_TABLET | ORAL | Status: DC
  Administered 2015-06-07: 2 via ORAL
  Filled 2015-06-06 (×2): qty 2

## 2015-06-06 MED ORDER — ONDANSETRON HCL 4 MG/2ML IJ SOLN: 4.0000 mg | Freq: Four times a day (QID) | INTRAMUSCULAR | Status: DC | PRN

## 2015-06-06 MED ORDER — FENTANYL 2.5 MCG/ML BUPIVACAINE 1/10 % EPIDURAL INFUSION (WH - ANES): 14.0000 mL/h | INTRAMUSCULAR | Status: DC | PRN

## 2015-06-06 MED ORDER — FENTANYL 2.5 MCG/ML BUPIVACAINE 1/10 % EPIDURAL INFUSION (WH - ANES)
14.0000 mL/h | INTRAMUSCULAR | Status: DC | PRN
  Administered 2015-06-06: 14 mL/h via EPIDURAL
  Filled 2015-06-06: qty 125

## 2015-06-06 MED ORDER — LACTATED RINGERS IV SOLN
INTRAVENOUS | Status: DC
  Administered 2015-06-06 (×2): via INTRAVENOUS

## 2015-06-06 MED ORDER — DIBUCAINE 1 % RE OINT: 1.0000 "application " | TOPICAL_OINTMENT | RECTAL | Status: DC | PRN

## 2015-06-06 MED ORDER — BENZOCAINE-MENTHOL 20-0.5 % EX AERO: 1.0000 "application " | INHALATION_SPRAY | CUTANEOUS | Status: DC | PRN

## 2015-06-06 MED ORDER — OXYTOCIN 40 UNITS IN LACTATED RINGERS INFUSION - SIMPLE MED
62.5000 mL/h | INTRAVENOUS | Status: DC
  Filled 2015-06-06: qty 1000

## 2015-06-06 NOTE — Progress Notes (Signed)
  Subjective: Assuming care of this 39 yo G3P2002 @ 39.5 wks admitted in active labor and for LOF. Forebag noted and AROM'd at 18:16, particulate mec noted. Comfortable w/ epidural. Feeling intermittent pressure. Family at bedside.  Objective: BP 120/66 mmHg  Pulse 68  Temp(Src) 98.7 F (37.1 C) (Oral)  Resp 18  Ht 5\' 3"  (1.6 m)  Wt 79.379 kg (175 lb)  BMI 31.01 kg/m2  SpO2 99%  LMP 09/01/2014 I/O last 3 completed shifts: In: -  Out: 100 [Urine:100]   Today's Vitals   06/06/15 1841 06/06/15 1900 06/06/15 1931 06/06/15 2001  BP: 91/56 120/66 135/73 104/74  Pulse: 92 68 81 101  Temp:   98.7 F (37.1 C)   TempSrc:   Oral   Resp: 18 18 18 18   Height:      Weight:      SpO2:      PainSc: 0-No pain 0-No pain     FHT: BL 140 w/ moderate variability, +accels, intermittent mild variables, earlys UC:   irregular, every 2-4 minutes SVE: Deferred   Assessment:  IUP at 39.5 wks Active labor GBS neg AMA  Plan: Continue plan Expect SVD  Sherre Scarlet CNM 06/06/2015, 8:10 PM

## 2015-06-06 NOTE — MAU Note (Signed)
Pt requests to have her cervix checked again by [facility], CNM before leaving because she is feeling more pressure in her pelvic area

## 2015-06-06 NOTE — H&P (Signed)
Tamara Reese is a 39 y.o. female, G3 P2 at 39.5 weeks returned with increased ctx and leaking.    Patient Active Problem List   Diagnosis Date Noted  . Non-reactive NST (non-stress test)   . [redacted] weeks gestation of pregnancy     Pregnancy Course: Patient entered care at 8.6 weeks.   EDC of 06/08/15 was established by [place].      [place] evaluations:   22.2 weeks - Anatomy: EFW 1lb 3oz - 75%, cervical length 4.75,FHR 150,  Posterior placenta     -   Significant prenatal events:   Last child 9lb 5oz,   LGA, AMA, abx allergy Last evaluation:   39.5 weeks   VE:4cm today  Reason for admission:  Transition labor   Pt States:   Contractions Frequency: 2-3         Contraction severity: strong         Fetal activity: +FM  OB History    Gravida Para Term Preterm AB TAB SAB Ectopic Multiple Living   3 2        2      Past Medical History  Diagnosis Date  . 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 IUD (intrauterine device) in place   . Yeast infection     h/o  . Deafness in right ear    Past Surgical History  Procedure Laterality Date  . Wisdom tooth extraction     Family History: family history includes Cancer in her maternal aunt; Heart disease in her maternal grandfather and paternal grandfather; Hypertension in her maternal grandfather; Stroke in her maternal grandmother. Social History:  reports that she has never smoked. She has never used smokeless tobacco. She reports that she does not drink alcohol or use illicit drugs.   Prenatal Transfer Tool  Maternal Diabetes: No Genetic Screening: Normal Maternal Ultrasounds/Referrals: Normal Fetal Ultrasounds or other Referrals:  None Maternal Substance Abuse:  No Significant Maternal Medications:  None Significant Maternal Lab Results: None   ROS:  See HPI above, all other systems are negative  Allergies  Allergen Reactions  . Erythromycin Nausea Only  . Keflex [Cephalexin] Swelling  . Penicillins Rash    Dilation: 9 Effacement  (%): 90 Station: 0 Exam by:: Chemika Nightengale, CNM Blood pressure 91/56, pulse 92, temperature 98.1 F (36.7 C), temperature source Oral, resp. rate 18, height 5\' 3"  (1.6 m), weight 175 lb (79.379 kg), last menstrual period 09/01/2014, SpO2 99 %.  Maternal Exam:  Uterine Assessment: Contraction frequency is rare.  Abdomen: Gravid, non tender. Fundal height is aga.  Normal external genitalia, vulva, cervix, uterus and adnexa.  No lesions noted on exam.  Pelvis adequate for delivery.  Fetal presentation: Vertex by VE  Fetal Exam:  Monitor Surveillance : Continuous Monitoring / Intermitting per -  Mode: Ultrasound.  NICHD: Category 2 CTXs: Q 2-93minutes EFW   8 lbs  Physical Exam: Nursing note and vitals reviewed General: alert and cooperative She appears well nourished Psychiatric: Normal mood and affect. Her behavior is normal Head: Normocephalic Eyes: Pupils are equal, round, and reactive to light Neck: Normal range of motion Cardiovascular: RRR without murmur  Respiratory: CTAB. Effort normal  Abd: soft, non-tender, +BS, no rebound, no guarding  Genitourinary: Vagina normal  Neurological: A&Ox3 Skin: Warm and dry  Musculoskeletal: Normal range of motion  Homan's sign negative bilaterally No evidence of DVTs.  Edema: minimal bilaterally non-pitting edema DTR: 2+ Clonus: None   Prenatal labs: ABO, Rh: --/--/A POS (06/20 1715) Antibody: NEG (  06/20 1715) Rubella:    RPR: Nonreactive (11/05 0000)  HBsAg: Negative (11/05 0000)  HIV: Non-reactive (11/05 0000)  GBS: Negative (05/19 0000) Sickle cell/Hgb electrophoresis:  WNL Pap:  ASCUS HPV negative GC:   negative Chlamydia: negative Genetic screenings:   Glucola:    Assessment:  IUP at 39.5 weeks NICHD: Category Membranes: SROM x 2hrs, AROM forebag lite mec Bishop Score: 8 GBS negative   Plan:  Admit to L&D for expectant management of labor. Epidural per patient request Foley cath after patient is comfortable  with epidural Anticipate SVD Labor mgmt as ordered     Attending MD available at all times.  Rodger Giangregorio, CNM, MSN 06/06/2015, 6:56 PM

## 2015-06-06 NOTE — Discharge Instructions (Signed)
Braxton Hicks Contractions °Contractions of the uterus can occur throughout pregnancy. Contractions are not always a sign that you are in labor.  °WHAT ARE BRAXTON HICKS CONTRACTIONS?  °Contractions that occur before labor are called Braxton Hicks contractions, or false labor. Toward the end of pregnancy (32-34 weeks), these contractions can develop more often and may become more forceful. This is not true labor because these contractions do not result in opening (dilatation) and thinning of the cervix. They are sometimes difficult to tell apart from true labor because these contractions can be forceful and people have different pain tolerances. You should not feel embarrassed if you go to the hospital with false labor. Sometimes, the only way to tell if you are in true labor is for your health care provider to look for changes in the cervix. °If there are no prenatal problems or other health problems associated with the pregnancy, it is completely safe to be sent home with false labor and await the onset of true labor. °HOW CAN YOU TELL THE DIFFERENCE BETWEEN TRUE AND FALSE LABOR? °False Labor °· The contractions of false labor are usually shorter and not as hard as those of true labor.   °· The contractions are usually irregular.   °· The contractions are often felt in the front of the lower abdomen and in the groin.   °· The contractions may go away when you walk around or change positions while lying down.   °· The contractions get weaker and are shorter lasting as time goes on.   °· The contractions do not usually become progressively stronger, regular, and closer together as with true labor.   °True Labor °· Contractions in true labor last 30-70 seconds, become very regular, usually become more intense, and increase in frequency.   °· The contractions do not go away with walking.   °· The discomfort is usually felt in the top of the uterus and spreads to the lower abdomen and low back.   °· True labor can be  determined by your health care provider with an exam. This will show that the cervix is dilating and getting thinner.   °WHAT TO REMEMBER °· Keep up with your usual exercises and follow other instructions given by your health care provider.   °· Take medicines as directed by your health care provider.   °· Keep your regular prenatal appointments.   °· Eat and drink lightly if you think you are going into labor.   °· If Braxton Hicks contractions are making you uncomfortable:   °¨ Change your position from lying down or resting to walking, or from walking to resting.   °¨ Sit and rest in a tub of warm water.   °¨ Drink 2-3 glasses of water. Dehydration may cause these contractions.   °¨ Do slow and deep breathing several times an hour.   °WHEN SHOULD I SEEK IMMEDIATE MEDICAL CARE? °Seek immediate medical care if: °· Your contractions become stronger, more regular, and closer together.   °· You have fluid leaking or gushing from your vagina.   °· You have a fever.    °· You have vaginal bleeding.   °· You have continuous abdominal pain.   °· You have low back pain that you never had before.   °· You feel your baby's head pushing down and causing pelvic pressure.   °· Your baby is not moving as much as it used to.   °Document Released: 12/03/2005 Document Revised: 12/08/2013 Document Reviewed: 09/14/2013 °Exit[employer]® Patient Information ©2015 [employer]. This information is not intended to replace advice given to you by your health care provider. Make sure you discuss any   questions you have with your health care provider. °Fetal Movement Counts °Patient Name: __________________________________________________ Patient Due Date: ____________________ °Performing a fetal movement count is highly recommended in high-risk pregnancies, but it is good for every pregnant woman to do. Your health care provider may ask you to start counting fetal movements at 28 weeks of the pregnancy. Fetal movements often increase: °· After  eating a full meal. °· After physical activity. °· After eating or drinking something sweet or cold. °· At rest. °Pay attention to when you feel the baby is most active. This will help you notice a pattern of your baby's sleep and wake cycles and what factors contribute to an increase in fetal movement. It is important to perform a fetal movement count at the same time each day when your baby is normally most active.  °HOW TO COUNT FETAL MOVEMENTS °· Find a quiet and comfortable area to sit or lie down on your left side. Lying on your left side provides the best blood and oxygen circulation to your baby. °· Write down the day and time on a sheet of paper or in a journal. °· Start counting kicks, flutters, swishes, rolls, or jabs in a 2-hour period. You should feel at least 10 movements within 2 hours. °· If you do not feel 10 movements in 2 hours, wait 2-3 hours and count again. Look for a change in the pattern or not enough counts in 2 hours. °SEEK MEDICAL CARE IF: °· You feel less than 10 counts in 2 hours, tried twice. °· There is no movement in over an hour. °· The pattern is changing or taking longer each day to reach 10 counts in 2 hours. °· You feel the baby is not moving as he or she usually does. °Date: ____________ Movements: ____________ Start time: ____________ Finish time: ____________  °Date: ____________ Movements: ____________ Start time: ____________ Finish time: ____________ °Date: ____________ Movements: ____________ Start time: ____________ Finish time: ____________ °Date: ____________ Movements: ____________ Start time: ____________ Finish time: ____________ °Date: ____________ Movements: ____________ Start time: ____________ Finish time: ____________ °Date: ____________ Movements: ____________ Start time: ____________ Finish time: ____________ °Date: ____________ Movements: ____________ Start time: ____________ Finish time: ____________ °Date: ____________ Movements: ____________ Start time:  ____________ Finish time: ____________  °Date: ____________ Movements: ____________ Start time: ____________ Finish time: ____________ °Date: ____________ Movements: ____________ Start time: ____________ Finish time: ____________ °Date: ____________ Movements: ____________ Start time: ____________ Finish time: ____________ °Date: ____________ Movements: ____________ Start time: ____________ Finish time: ____________ °Date: ____________ Movements: ____________ Start time: ____________ Finish time: ____________ °Date: ____________ Movements: ____________ Start time: ____________ Finish time: ____________ °Date: ____________ Movements: ____________ Start time: ____________ Finish time: ____________  °Date: ____________ Movements: ____________ Start time: ____________ Finish time: ____________ °Date: ____________ Movements: ____________ Start time: ____________ Finish time: ____________ °Date: ____________ Movements: ____________ Start time: ____________ Finish time: ____________ °Date: ____________ Movements: ____________ Start time: ____________ Finish time: ____________ °Date: ____________ Movements: ____________ Start time: ____________ Finish time: ____________ °Date: ____________ Movements: ____________ Start time: ____________ Finish time: ____________ °Date: ____________ Movements: ____________ Start time: ____________ Finish time: ____________  °Date: ____________ Movements: ____________ Start time: ____________ Finish time: ____________ °Date: ____________ Movements: ____________ Start time: ____________ Finish time: ____________ °Date: ____________ Movements: ____________ Start time: ____________ Finish time: ____________ °Date: ____________ Movements: ____________ Start time: ____________ Finish time: ____________ °Date: ____________ Movements: ____________ Start time: ____________ Finish time: ____________ °Date: ____________ Movements: ____________ Start time: ____________ Finish time: ____________ °Date:  ____________ Movements: ____________ Start time: ____________ Finish time: ____________  °  Date: ____________ Movements: ____________ Start time: ____________ Doreatha Martin time: ____________ Date: ____________ Movements: ____________ Start time: ____________ Doreatha Martin time: ____________ Date: ____________ Movements: ____________ Start time: ____________ Doreatha Martin time: ____________ Date: ____________ Movements: ____________ Start time: ____________ Doreatha Martin time: ____________ Date: ____________ Movements: ____________ Start time: ____________ Doreatha Martin time: ____________ Date: ____________ Movements: ____________ Start time: ____________ Doreatha Martin time: ____________ Date: ____________ Movements: ____________ Start time: ____________ Doreatha Martin time: ____________  Date: ____________ Movements: ____________ Start time: ____________ Doreatha Martin time: ____________ Date: ____________ Movements: ____________ Start time: ____________ Doreatha Martin time: ____________ Date: ____________ Movements: ____________ Start time: ____________ Doreatha Martin time: ____________ Date: ____________ Movements: ____________ Start time: ____________ Doreatha Martin time: ____________ Date: ____________ Movements: ____________ Start time: ____________ Doreatha Martin time: ____________ Date: ____________ Movements: ____________ Start time: ____________ Doreatha Martin time: ____________ Date: ____________ Movements: ____________ Start time: ____________ Doreatha Martin time: ____________  Date: ____________ Movements: ____________ Start time: ____________ Doreatha Martin time: ____________ Date: ____________ Movements: ____________ Start time: ____________ Doreatha Martin time: ____________ Date: ____________ Movements: ____________ Start time: ____________ Doreatha Martin time: ____________ Date: ____________ Movements: ____________ Start time: ____________ Doreatha Martin time: ____________ Date: ____________ Movements: ____________ Start time: ____________ Doreatha Martin time: ____________ Date: ____________ Movements: ____________ Start  time: ____________ Doreatha Martin time: ____________ Date: ____________ Movements: ____________ Start time: ____________ Doreatha Martin time: ____________  Date: ____________ Movements: ____________ Start time: ____________ Doreatha Martin time: ____________ Date: ____________ Movements: ____________ Start time: ____________ Doreatha Martin time: ____________ Date: ____________ Movements: ____________ Start time: ____________ Doreatha Martin time: ____________ Date: ____________ Movements: ____________ Start time: ____________ Doreatha Martin time: ____________ Date: ____________ Movements: ____________ Start time: ____________ Doreatha Martin time: ____________ Date: ____________ Movements: ____________ Start time: ____________ Doreatha Martin time: ____________ Document Released: 01/02/2007 Document Revised: 04/19/2014 Document Reviewed: 09/29/2012 ExitCare Patient Information 2015 [employer]. This information is not intended to replace advice given to you by your health care provider. Make sure you discuss any questions you have with your health care provider.

## 2015-06-06 NOTE — Progress Notes (Signed)
Pt states she is lifting the toco with each contraction because it hurts.  Toco readjusted.

## 2015-06-06 NOTE — Progress Notes (Signed)
Fern slide by RN collect is negative, CNM contacted, is in OR presently, will call back.

## 2015-06-06 NOTE — Progress Notes (Signed)
Pt called RN into room, states perineum feels wet.  Perineum appears wet, no active leaking noted.  Fern slide collected.

## 2015-06-06 NOTE — MAU Note (Signed)
Pt c/o contractions every 2 mins since 0100. Denies LOF. Some bloody show. +FM

## 2015-06-06 NOTE — MAU Note (Signed)
Radiologist called to report BPP 6/8, will also report to CNM.

## 2015-06-06 NOTE — MAU Provider Note (Signed)
MAU Addendum Note Assumed care from [facility] CNM.    VE unchanged from earlier.  Pt concern about going home again bc the intensity was strong. 2 hours SP IV pain medicine FHR 125 with minimum variability.  Pt to [place] for BPP and AFI.  Pt ok to walk after [place] for another hour if category 1 strip.       Maecyn Panning, CNM, MSN 06/06/2015. 10:25 AM   Addendum 1100 [place] BPP 6/8, AFI 17.7, Category 1 tracing VE unchanged Consult with Dr Stefano Gaul 1-2 percocet prior to DC Rx for percocet  DC pt to home in stable condition Kick counts Labor and bleeding precaution  Dewey Neukam, CNM

## 2015-06-06 NOTE — Anesthesia Procedure Notes (Signed)

## 2015-06-06 NOTE — Anesthesia Preprocedure Evaluation (Signed)

## 2015-06-07 LAB — CBC
HCT: 27.7 % — ABNORMAL LOW (ref 36.0–46.0)
Hemoglobin: 9.1 g/dL — ABNORMAL LOW (ref 12.0–15.0)
MCH: 29.1 pg (ref 26.0–34.0)
MCHC: 32.9 g/dL (ref 30.0–36.0)
MCV: 88.5 fL (ref 78.0–100.0)
PLATELETS: 206 10*3/uL (ref 150–400)
RBC: 3.13 MIL/uL — ABNORMAL LOW (ref 3.87–5.11)
RDW: 14.5 % (ref 11.5–15.5)
WBC: 16.4 10*3/uL — ABNORMAL HIGH (ref 4.0–10.5)

## 2015-06-07 LAB — RPR: RPR: NONREACTIVE

## 2015-06-07 MED ORDER — FERROUS SULFATE 325 (65 FE) MG PO TABS
325.0000 mg | ORAL_TABLET | Freq: Two times a day (BID) | ORAL | Status: DC
  Administered 2015-06-07: 325 mg via ORAL
  Filled 2015-06-07: qty 1

## 2015-06-07 NOTE — Progress Notes (Signed)
Tamara Reese   Subjective: Post Partum Day 1 Vaginal delivery, 1 degree laceration Patient up ad lib, denies syncope or dizziness. Reports consuming regular diet without issues and denies N/V No issues with urination and reports bleeding is appropriate  Feeding:  breast Contraceptive plan:   micronor  Objective: Temp:  [98 F (36.7 C)-99.5 F (37.5 C)] 98.3 F (36.8 C) (06/21 0401) Pulse Rate:  [65-124] 65 (06/21 0401) Resp:  [16-22] 18 (06/21 0401) BP: (91-162)/(56-86) 120/66 mmHg (06/21 0401) SpO2:  [99 %-100 %] 100 % (06/20 2255) Weight:  [175 lb (79.379 kg)] 175 lb (79.379 kg) (06/20 1710)  Physical Exam:  General: alert and cooperative Ext: WNL, no edema. No evidence of DVT seen on physical exam. Breast: Soft filling Lungs: CTAB Heart RRR without murmur  Abdomen:  Soft, fundus firm, lochia scant, + bowel sounds, non distended, non tender Lochia: appropriate Uterine Fundus: firm Laceration: healing well    Recent Labs  06/06/15 1715 06/07/15 0514  HGB 11.7* 9.1*  HCT 35.4* 27.7*    Assessment S/P Vaginal Delivery-Day 1 Stable  Normal Involution Breastfeeding asymptotic anemia   Plan: Continue current care Plan for discharge tomorrow, Breastfeeding and Lactation consult Lactation support    Corri Delapaz, CNM, MSN 06/07/2015, 7:15 AM

## 2015-06-07 NOTE — Lactation Note (Signed)
This note was copied from the chart of Girl Winifred Weiskopf. Lactation Consultation Note  Patient Name: Girl Asmara Winfield Today's Date: 06/07/2015 Reason for consult: Initial assessment (per mom recently breast fed , and encouraged to call for latch assist )  Baby is 35 1/2 hours old and has been to the breast several times according to mom and to the doc flow sheets.  Baby has been to the breast 11 x's in her life Breast feeding range 10 -45 mins , average 15 -20 mins feedings,  Latch scores 8-10, 2 wets , 4 stools. Baby presently sleeping in crib at bedside. LC discuss the  importance and benefits of  Skin to skin . LC encouraged mom to call with feeding cues via nurses light for feeding assessment and latch score. Mother informed of post-discharge support and given phone number to the lactation department, including services for phone  call assistance; out-patient appointments; and breastfeeding support group. List of other breastfeeding resources in the community  given in the handout. Encouraged mother to call for problems or concerns related to breastfeeding.    Maternal Data Does the patient have breastfeeding experience prior to this delivery?: Yes  Feeding Feeding Type:  (per mom last fed around 130 pm . ) Length of feed: 10 min  LATCH Score/Interventions                      Lactation Tools Discussed/Used     Consult Status Consult Status: Follow-up Date: 06/07/15 Follow-up type: In-patient    Kathrin Greathouse 06/07/2015, 4:13 PM

## 2015-06-08 MED ORDER — IBUPROFEN 600 MG PO TABS: 600.0000 mg | ORAL_TABLET | Freq: Four times a day (QID) | ORAL | Status: AC | PRN

## 2015-06-08 MED ORDER — FERROUS SULFATE 325 (65 FE) MG PO TABS: 325.0000 mg | ORAL_TABLET | Freq: Every day | ORAL | Status: AC

## 2015-06-08 MED ORDER — NORETHINDRONE 0.35 MG PO TABS: 1.0000 | ORAL_TABLET | Freq: Every day | ORAL | Status: AC

## 2015-06-08 NOTE — Discharge Summary (Signed)
Vaginal Delivery Discharge Summary  [name]  DOB:    11-03-1976 MRN:    [id number] CSN:    [id number]  Date of admission:                  06/06/15  Date of discharge:                   06/08/15  Procedures this admission:   SVB, repair of 1st degree perineal  Date of Delivery: 06/06/15  Newborn Data:  Live born female  Birth Weight: 8 lb 10.3 oz (3920 g) APGAR: 8, 9  Home with mother. Name: [facility]   History of Present Illness:  Ms. Tamara Reese is a 39 y.o. female, G3P1001, who presents at [redacted]w[redacted]d weeks gestation. The patient has been followed at [facility] and Gynecology division of [facility]. She was admitted for onset of labor and rupture of membranes. Her pregnancy has been complicated by:  Patient Active Problem List   Diagnosis Date Noted  . Vaginal delivery 06/06/2015     Hospital Course:   Admitting Dx:   IUP at 39 5/7 weeks, SROM, early labor GBS Status:  Negative Delivering Clinician:   Sherre Scarlet, CNM Lacerations/MLE: 1st degree perineal Complications: None  Intrapartum Procedures: spontaneous vaginal delivery Postpartum Procedures: none Complications-Operative and Postpartum: 1st degree perineal laceration  Discharge Diagnoses: Term Pregnancy-delivered  Feeding:  breast  Contraception:  oral progesterone-only contraceptive  Hemoglobin Results:  CBC Latest Ref Rng 06/07/2015 06/06/2015 11/16/2014  WBC 4.0 - 10.5 K/uL 16.4(H) 17.9(H) 9.3  Hemoglobin 12.0 - 15.0 g/dL 7.6(O) 11.7(L) 12.7  Hematocrit 36.0 - 46.0 % 27.7(L) 35.4(L) 35.8(L)  Platelets 150 - 400 K/uL 206 229 252    Discharge Physical Exam:   General: alert Lochia: appropriate Uterine Fundus: firm Incision: healing well DVT Evaluation: No evidence of DVT seen on physical exam. Negative Homan's sign.   Discharge Information:  Activity:           pelvic rest Diet:                routine Medications: Ibuprofen, Iron and  Micronor Condition:      stable Instructions:  Routine pp instructions   Discharge to: home  Follow-up Information    Follow up with [facility]. Schedule an appointment as soon as possible for a visit in 6 weeks.   Specialty:  Obstetrics and Gynecology   Why:  Call for any questions or concerns.   Contact information:   [address] [place] [place] [address] [phone]       Nigel Bridgeman [facility] 06/08/2015 7:32 AM

## 2015-06-08 NOTE — Addendum Note (Signed)
Addendum  created 06/08/15 1219 by Fabienne Bruns, RN   Modules edited: Anesthesia LDA, Lines/Drains/Airways Properties Editor   Lines/Drains/Airways Properties Editor:  Properties of line/drain/airway/wound [REMOVED] Perineural (Nerve Sheath) Catheter 06/06/15 have been modified.

## 2015-06-08 NOTE — Discharge Instructions (Signed)
Postpartum Care After Vaginal Delivery °After you deliver your newborn (postpartum period), the usual stay in the hospital is 24-72 hours. If there were problems with your labor or delivery, or if you have other medical problems, you might be in the hospital longer.  °While you are in the hospital, you will receive help and instructions on how to care for yourself and your newborn during the postpartum period.  °While you are in the hospital: °· Be sure to tell your nurses if you have pain or discomfort, as well as where you feel the pain and what makes the pain worse. °· If you had an incision made near your vagina (episiotomy) or if you had some tearing during delivery, the nurses may put ice packs on your episiotomy or tear. The ice packs may help to reduce the pain and swelling. °· If you are breastfeeding, you may feel uncomfortable contractions of your uterus for a couple of weeks. This is normal. The contractions help your uterus get back to normal size. °· It is normal to have some bleeding after delivery. °· For the first 1-3 days after delivery, the flow is red and the amount may be similar to a period. °· It is common for the flow to start and stop. °· In the first few days, you may pass some small clots. Let your nurses know if you begin to pass large clots or your flow increases. °· Do not  flush blood clots down the toilet before having the nurse look at them. °· During the next 3-10 days after delivery, your flow should become more watery and pink or brown-tinged in color. °· Ten to fourteen days after delivery, your flow should be a small amount of yellowish-white discharge. °· The amount of your flow will decrease over the first few weeks after delivery. Your flow may stop in 6-8 weeks. Most women have had their flow stop by 12 weeks after delivery. °· You should change your sanitary pads frequently. °· Wash your hands thoroughly with soap and water for at least 20 seconds after changing pads, using  the toilet, or before holding or feeding your newborn. °· You should feel like you need to empty your bladder within the first 6-8 hours after delivery. °· In case you become weak, lightheaded, or faint, call your nurse before you get out of bed for the first time and before you take a shower for the first time. °· Within the first few days after delivery, your breasts may begin to feel tender and full. This is called engorgement. Breast tenderness usually goes away within 48-72 hours after engorgement occurs. You may also notice milk leaking from your breasts. If you are not breastfeeding, do not stimulate your breasts. Breast stimulation can make your breasts produce more milk. °· Spending as much time as possible with your newborn is very important. During this time, you and your newborn can feel close and get to know each other. Having your newborn stay in your room (rooming in) will help to strengthen the bond with your newborn.  It will give you time to get to know your newborn and become comfortable caring for your newborn. °· Your hormones change after delivery. Sometimes the hormone changes can temporarily cause you to feel sad or tearful. These feelings should not last more than a few days. If these feelings last longer than that, you should talk to your caregiver. °· If desired, talk to your caregiver about methods of family planning or contraception. °·   Talk to your caregiver about immunizations. Your caregiver may want you to have the following immunizations before leaving the hospital: °· Tetanus, diphtheria, and pertussis (Tdap) or tetanus and diphtheria (Td) immunization. It is very important that you and your family (including grandparents) or others caring for your newborn are up-to-date with the Tdap or Td immunizations. The Tdap or Td immunization can help protect your newborn from getting ill. °· Rubella immunization. °· Varicella (chickenpox) immunization. °· Influenza immunization. You should  receive this annual immunization if you did not receive the immunization during your pregnancy. °Document Released: 09/30/2007 Document Revised: 08/27/2012 Document Reviewed: 07/30/2012 °ExitCare® Patient Information ©2015 [employer]. This information is not intended to replace advice given to you by your health care provider. Make sure you discuss any questions you have with your health care provider. ° °Iron Deficiency Anemia °Anemia is a condition in which there are less red blood cells or hemoglobin in the blood than normal. Hemoglobin is the part of red blood cells that carries oxygen. Iron deficiency anemia is anemia caused by too little iron. It is the most common type of anemia. It may leave you tired and short of breath. °CAUSES  °· Lack of iron in the diet. °· Poor absorption of iron, as seen with intestinal disorders. °· Intestinal bleeding. °· Heavy periods. °SIGNS AND SYMPTOMS  °Mild anemia may not be noticeable. Symptoms may include: °· Fatigue. °· Headache. °· Pale skin. °· Weakness. °· Tiredness. °· Shortness of breath. °· Dizziness. °· Cold hands and feet. °· Fast or irregular heartbeat. °DIAGNOSIS  °Diagnosis requires a thorough evaluation and physical exam by your health care provider. Blood tests are generally used to confirm iron deficiency anemia. Additional tests may be done to find the underlying cause of your anemia. These may include: °· Testing for blood in the stool (fecal occult blood test). °· A procedure to see inside the colon and rectum (colonoscopy). °· A procedure to see inside the esophagus and stomach (endoscopy). °TREATMENT  °Iron deficiency anemia is treated by correcting the cause of the deficiency. Treatment may involve: °· Adding iron-rich foods to your diet. °· Taking iron supplements. Pregnant or breastfeeding women need to take extra iron because their normal diet usually does not provide the required amount. °· Taking vitamins. Vitamin C improves the absorption of  iron. Your health care provider may recommend that you take your iron tablets with a glass of orange juice or vitamin C supplement. °· Medicines to make heavy menstrual flow lighter. °· Surgery. °HOME CARE INSTRUCTIONS  °· Take iron as directed by your health care provider. °¨ If you cannot tolerate taking iron supplements by mouth, talk to your health care provider about taking them through a vein (intravenously) or an injection into a muscle. °¨ For the best iron absorption, iron supplements should be taken on an empty stomach. If you cannot tolerate them on an empty stomach, you may need to take them with food. °¨ Do not drink milk or take antacids at the same time as your iron supplements. Milk and antacids may interfere with the absorption of iron. °¨ Iron supplements can cause constipation. Make sure to include fiber in your diet to prevent constipation. A stool softener may also be recommended. °· Take vitamins as directed by your health care provider. °· Eat a diet rich in iron. Foods high in iron include liver, lean beef, whole-grain bread, eggs, dried fruit, and dark green leafy vegetables. °SEEK IMMEDIATE MEDICAL CARE IF:  °· You   faint. If this happens, do not drive. Call your local emergency services (911 in [place] if no other help is available. °· You have chest pain. °· You feel nauseous or vomit. °· You have severe or increased shortness of breath with activity. °· You feel weak. °· You have a rapid heartbeat. °· You have unexplained sweating. °· You become light-headed when getting up from a chair or bed. °MAKE SURE YOU:  °· Understand these instructions. °· Will watch your condition. °· Will get help right away if you are not doing well or get worse. °Document Released: 11/30/2000 Document Revised: 12/08/2013 Document Reviewed: 08/10/2013 °ExitCare® Patient Information ©2015 [employer]. This information is not intended to replace advice given to you by your health care provider. Make sure you  discuss any questions you have with your health care provider. ° °

## 2017-10-08 ENCOUNTER — Other Ambulatory Visit (HOSPITAL_COMMUNITY): Payer: Self-pay | Admitting: Obstetrics and Gynecology

## 2017-10-08 DIAGNOSIS — Z3A13 13 weeks gestation of pregnancy: Secondary | ICD-10-CM

## 2017-10-08 DIAGNOSIS — R899 Unspecified abnormal finding in specimens from other organs, systems and tissues: Secondary | ICD-10-CM

## 2017-10-10 ENCOUNTER — Encounter (HOSPITAL_COMMUNITY): Payer: Self-pay | Admitting: *Deleted

## 2017-10-11 ENCOUNTER — Ambulatory Visit (HOSPITAL_COMMUNITY)
Admission: RE | Admit: 2017-10-11 | Discharge: 2017-10-11 | Disposition: A | Discharge status: Home / Self Care | Payer: BLUE CROSS/BLUE SHIELD | Source: Ambulatory Visit | Attending: Obstetrics and Gynecology | Admitting: Obstetrics and Gynecology

## 2017-10-11 ENCOUNTER — Other Ambulatory Visit (HOSPITAL_COMMUNITY): Payer: Self-pay | Admitting: *Deleted

## 2017-10-11 ENCOUNTER — Encounter (HOSPITAL_COMMUNITY): Payer: Self-pay

## 2017-10-11 ENCOUNTER — Encounter (HOSPITAL_COMMUNITY): Payer: BLUE CROSS/BLUE SHIELD

## 2017-10-11 DIAGNOSIS — O358XX0 Maternal care for other (suspected) fetal abnormality and damage, not applicable or unspecified: Secondary | ICD-10-CM | POA: Insufficient documentation

## 2017-10-11 DIAGNOSIS — Z368A Encounter for antenatal screening for other genetic defects: Secondary | ICD-10-CM | POA: Diagnosis not present

## 2017-10-11 DIAGNOSIS — R899 Unspecified abnormal finding in specimens from other organs, systems and tissues: Secondary | ICD-10-CM

## 2017-10-11 DIAGNOSIS — Z3A13 13 weeks gestation of pregnancy: Secondary | ICD-10-CM | POA: Diagnosis not present

## 2017-10-11 DIAGNOSIS — O285 Abnormal chromosomal and genetic finding on antenatal screening of mother: Secondary | ICD-10-CM

## 2017-10-11 DIAGNOSIS — O3510X0 Maternal care for (suspected) chromosomal abnormality in fetus, unspecified, not applicable or unspecified: Secondary | ICD-10-CM | POA: Insufficient documentation

## 2017-10-11 DIAGNOSIS — O351XX0 Maternal care for (suspected) chromosomal abnormality in fetus, not applicable or unspecified: Secondary | ICD-10-CM | POA: Insufficient documentation

## 2017-10-11 NOTE — Progress Notes (Signed)
Genetic Counseling  High-Risk Gestation Note  Appointment Date:  10/11/2017 Referred By: Tamara Reese, CNM Date of Birth:  [birth year] Partner:  Tamara Reese   Pregnancy History: [id number] Estimated Date of Delivery: 04/16/18 Estimated Gestational Age: [redacted]w[redacted]d Attending: Ledon Snare, MD   [name] was seen for genetic counseling because noninvasive prenatal screening (NIPS)/prenatal cell free DNA testing indicated a high risk for Trisomy 21 in the pregnancy. She is 41 y.o..      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 Otho Perl   Amniocentesis- declined at this time  Discussed options for pregnancy including continuing, adoption, and termination of pregnancy  Patient would like to pursue termination of pregnancy in the case that trisomy 73 is confirmed on [facility]  Reviewed family history concerns  Discussed carrier screening options- declined  CF  SMA  Hemoglobinopathies  She was counseled regarding maternal age and the association with risk for chromosome conditions due to nondisjunction with aging of the ova.  Tamara Reese was offered noninvasive prenatal screening (NIPs)/prenatal cell free DNA through her OB provider given this association. Specifically, she had Panorama through [facility], which indicated a high risk for Trisomy 21, reported as a 39 in 10 risk. We discussed that this testing identifies >99% of pregnancies with Down syndrome with a false positive rate of 0.1%. We reviewed that this testing is highly sensitive and specific, but is not considered diagnostic. We reviewed that the positive predictive value  (PPV) is the percentage of those patients who have a positive NIPS result who actually have fetal Down syndrome. Specific to this laboratory and the patient's age, the PPV is 93%. We spent time reviewing this technology and the accuracy of NIPS and other screening versus diagnostic tests.    Ultrasound was performed today, and cystic hygroma with skin edema was visualized. She was counseled that a cystic hygroma describes a septated fluid filled sac at the back of the neck that typically results from failure of the fetal lymphatic system.  Various etiologies for a hygroma including normal variation (immature lymphatic system), a chromosome condition, single gene condition, or a congenital anomaly (heart defect). Approximately 50% of cases of fetal cystic hygroma are due to an underlying chromosome condition. We discussed that the fetal prognosis is largely dependent upon the underlying cause of the hygroma, but that there are indications by ultrasound, such as hydrops, which significantly increase the likelihood of a poor prognosis.  She understands that cystic hygromas may worsen and progress to hydrops fetalis, improve and regress, or remain unchanged at follow up ultrasounds.  She was counseled that given the ultrasound findings and the positive cell free DNA test result, the diagnosis of Down syndrome is highly suspected in the pregnancy. We reviewed that the cell free DNA test can not distinguish between aneuploidy confined to the placenta, trisomy 21, translocation 21, or mosaic trisomy 21. We discussed the availability of chorionic villus sampling, amniocentesis, or peripheral blood chromosome analysis for confirmatory testing through karyotype analysis. The risks, benefits, and limitations were reviewed including the associated 1 in 100 risk for complications with [facility] and 1 in 300-500 risk for complications with amniocentesis. After careful consideration, [name] elected to pursue [facility] for karyotype  analysis, which was scheduled for 10/15/17.    Considering the very high suspicion of Down syndrome, she was counseled regarding this diagnosis. We discussed that there are different types of Down syndrome, and each type is determined by the arrangement of the #21 chromosomes. Approximately 95% of cases of Down syndrome are trisomy 21 and 2-4% are due to a translocation involving chromosome 21. We reviewed chromosomes, nondisjunction, and that chromosome division errors happen by chance and are not usually inherited. They understand that confirmatory testing will provide an actual karyotype and will help to determine accurate risks of recurrence for a future pregnancy.   Down syndrome occurs once per every 800 births and is associated with specific features. Ms. Tamara Reese is aware that there is a high degree of variability seen among children who have this condition, meaning that every child with Down syndrome will not be affected in exactly the same way, and some children will have more or less features than others. Approximately half of individuals with Down syndrome have a cardiac anomaly and ~10-15% have an intestinal difference. Other anomalies of various organ systems have also been described in association with this diagnosis. Most individuals with Down syndrome have mild to moderate intellectual disability and likely will require extra assistance with school work. Early intervention services such as physical, occupational, and speech therapies can help with achievement of developmental milestones.  The AAP have established health supervision guidelines for individuals with Down syndrome. These guidelines provide medical management recommendations for various stages of life and can be a resource for families who have a child with Down syndrome as well as for health professionals. With the advances in medical technology, early intervention, and supportive therapies, many individuals with Down  syndrome are able to live with an increasing degree of independence. Today, many adults with Down syndrome care for themselves, have jobs, and often times live in group homes or apartments where assistance is available if needed.   We discussed options for the pregnancy including continuing the pregnancy with increased surveillance, adoption, and termination of pregnancy. Mrs.Tamara Reese stated that she and her husband plan to pursue termination of pregnancy in the case that Down syndrome is confirmed in the pregnancy. She is aware of that the limit for TOP in [place] is [redacted] weeks gestation.   [name] was provided with written information regarding cystic fibrosis (CF), spinal muscular atrophy (SMA) and hemoglobinopathies including the carrier frequency, availability of carrier screening and prenatal diagnosis if indicated.  In addition, we discussed that CF and hemoglobinopathies are routinely screened for as part of the [place] newborn screening panel.  After further discussion, she declined screening for CF, SMA and hemoglobinopathies.  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 No additional relatives were reported with hearing loss or deafness, including the patient's three children.  Without further information regarding the provided family history, an accurate genetic risk cannot be calculated. Further genetic counseling is warranted if more information is obtained.  [name] denied exposure to environmental toxins or chemical agents. She denied the use of alcohol, tobacco or street drugs. She denied significant viral illnesses during the course of her pregnancy. Her medical and surgical histories were noncontributory.   I counseled [name] regarding the above risks and available options.  The approximate face-to-face time with the genetic counselor was 40 minutes.  Clydie BraunKaren Borna Wessinger,  MS,  Certified Genetic Counselor 10/11/2017

## 2017-10-15 ENCOUNTER — Ambulatory Visit (HOSPITAL_COMMUNITY): Admission: RE | Admit: 2017-10-15 | Payer: BLUE CROSS/BLUE SHIELD | Source: Ambulatory Visit

## 2017-10-15 ENCOUNTER — Encounter (HOSPITAL_COMMUNITY): Payer: Self-pay

## 2017-10-15 ENCOUNTER — Encounter (HOSPITAL_COMMUNITY): Payer: Self-pay | Admitting: Maternal and Fetal Medicine

## 2017-10-15 ENCOUNTER — Ambulatory Visit (HOSPITAL_COMMUNITY)
Admission: RE | Admit: 2017-10-15 | Discharge: 2017-10-15 | Disposition: A | Discharge status: Home / Self Care | Payer: BLUE CROSS/BLUE SHIELD | Source: Ambulatory Visit | Attending: Obstetrics and Gynecology | Admitting: Obstetrics and Gynecology

## 2017-10-15 DIAGNOSIS — O09511 Supervision of elderly primigravida, first trimester: Secondary | ICD-10-CM | POA: Insufficient documentation

## 2017-10-15 DIAGNOSIS — Z3A13 13 weeks gestation of pregnancy: Secondary | ICD-10-CM | POA: Insufficient documentation

## 2017-10-15 DIAGNOSIS — O289 Unspecified abnormal findings on antenatal screening of mother: Secondary | ICD-10-CM | POA: Diagnosis not present

## 2017-10-15 DIAGNOSIS — O285 Abnormal chromosomal and genetic finding on antenatal screening of mother: Secondary | ICD-10-CM | POA: Diagnosis not present

## 2017-10-15 DIAGNOSIS — O283 Abnormal ultrasonic finding on antenatal screening of mother: Secondary | ICD-10-CM | POA: Insufficient documentation

## 2017-10-15 LAB — ROUTINE CHROMOSOME - KARYOTYPE + FISH

## 2017-10-15 NOTE — Progress Notes (Deleted)
Tamara Reese is a 41 y.o. female presenting for ***. OB History    Gravida Para Term Preterm AB Living   4 3 3     3    SAB TAB Ectopic Multiple Live Births         0 1     Past Medical History:  Diagnosis Date  . Deafness in right ear   . IUD (intrauterine device) in place   . 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 Yeast infection    h/o   Past Surgical History:  Procedure Laterality Date  . WISDOM TOOTH EXTRACTION     Family History: family history includes Cancer in her maternal aunt; Heart disease in her maternal grandfather and paternal grandfather; Hypertension in her maternal grandfather; Stroke in her maternal grandmother. Social History:  reports that she has never smoked. She has never used smokeless tobacco. She reports that she does not drink alcohol or use drugs.     Maternal Diabetes: {Maternal Diabetes:3043596} Genetic Screening: {Genetic Screening:20205} Maternal Ultrasounds/Referrals: {Maternal Ultrasounds / Referrals:20211} Fetal Ultrasounds or other Referrals:  {Fetal Ultrasounds or Other Referrals:20213} Maternal Substance Abuse:  {Maternal Substance Abuse:20223} Significant Maternal Medications:  {Significant Maternal Meds:20233} Significant Maternal Lab Results:  {Significant Maternal Lab Results:20235} Other Comments:  {Other Comments:20251}  ROS History   Last menstrual period 07/10/2017, unknown if currently breastfeeding. Exam Physical Exam  Prenatal labs: ABO, Rh:   Antibody:   Rubella:   RPR:    HBsAg:    HIV:    GBS:     Assessment/Plan: ***   Tamara Reese 10/15/2017, 1:39 PM

## 2017-10-15 NOTE — Progress Notes (Signed)
I discussed Ms. Robson's pregnancy management options in detail with her today including continuing and terminating the pregnancy.  She is certain she wishes to proceed with termination via D&C/D&E at this time.  I reviewed the material required for informed consent under the Woman's Right to Know Act with her today.  She signed the necessary paperwork at 12:35 on 10/15/17.  Electronically signed by: Rema FendtNITSCHE,Shulem Mader 10/15/17 1:40 PM

## 2017-10-16 ENCOUNTER — Other Ambulatory Visit (HOSPITAL_COMMUNITY): Payer: Self-pay

## 2017-10-16 ENCOUNTER — Encounter (HOSPITAL_COMMUNITY): Payer: Self-pay

## 2017-10-17 ENCOUNTER — Other Ambulatory Visit (HOSPITAL_COMMUNITY): Payer: Self-pay

## 2017-10-17 ENCOUNTER — Telehealth (HOSPITAL_COMMUNITY): Payer: Self-pay | Admitting: MS"

## 2017-10-17 NOTE — Telephone Encounter (Signed)
Called Tamara Reese regarding FISH for [facility]euploidy results [facility]. Patient identified by name and DOB. Discussed that these are positive for Down syndrome (+ 21). Final karyotype is pending. Reviewed that Dr. Otho PerlNitsche is working to schedule the patient's procedure, likely to occur next week and that we will be back in contact once this is scheduled. All questions answered to her satisfaction at this time.   Clydie BraunKaren Crystalle Popwell 10/17/2017 8:49 AM

## 2017-10-21 ENCOUNTER — Telehealth (HOSPITAL_COMMUNITY): Payer: Self-pay | Admitting: MS"

## 2017-10-21 NOTE — Telephone Encounter (Signed)
Patient calling to inquire about scheduling of procedure. Per her discussion with Dr. Otho PerlNitsche on Friday, scheduling of D&E with OR time was not aligning with his availability. Dr. Bobbe MedicoApril Miller was able to schedule procedure. Patient has appointment with Dr. Hyacinth [facility]ekerMiller today [facility] [place] and [facility], patient to arrive at 6:00 am. All questions answered to patient's satisfaction at this time.   Tamara BraunKaren Mathilda Reese  10/21/2017 10:03 AM

## 2017-10-29 ENCOUNTER — Telehealth (HOSPITAL_COMMUNITY): Payer: Self-pay | Admitting: MS"

## 2017-10-29 ENCOUNTER — Encounter (HOSPITAL_COMMUNITY): Payer: Self-pay | Admitting: MS"

## 2017-10-29 NOTE — Telephone Encounter (Signed)
Called Mrs. Juanelle K Doane regarding final karyotype results from [facility]. Patient identified by name and DOB. Discussed that karyotype analysis was consistent with FISH analysis. 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 Discussed that this is the most common form of Down syndrome and typically occurs at conception. Recurrence risk for future pregnancies for trisomy would be slightly increased above the age related risk. We discussed for example risk for Down syndrome in the first trimester for a women age 41 years old at delivery is approximately 1 in 2529, and recurrence risk would be adjusted above this to approximately 5%. Reviewed available screening and testing options that would be available in a future pregnancy to assess for fetal aneuploidy. We also discussed available grief counseling resources for the patient or couple including local grief counselors with experience in perinatal loss and the support group [employer]. Ms. Janee Mornhompson was encouraged to call with additional questions or concerns.   Clydie BraunKaren Opal Dinning  10/29/2017 3:10 PM

## 2017-10-30 ENCOUNTER — Other Ambulatory Visit: Payer: Self-pay
